# Patient Record
Sex: Male | Born: 1980 | Race: White | Hispanic: No | Marital: Married | Smoking: Never smoker
Health system: Southern US, Community
[De-identification: ages and names within clinical notes are randomized; demographics above are authoritative.]

## PROBLEM LIST (undated history)

## (undated) DIAGNOSIS — S62609A Fracture of unspecified phalanx of unspecified finger, initial encounter for closed fracture: Secondary | ICD-10-CM

## (undated) DIAGNOSIS — Q244 Congenital subaortic stenosis: Secondary | ICD-10-CM

## (undated) DIAGNOSIS — R569 Unspecified convulsions: Secondary | ICD-10-CM

## (undated) HISTORY — PX: TESTICLE SURGERY: SHX794

## (undated) HISTORY — PX: OTHER SURGICAL HISTORY: SHX169

---
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
## 2003-05-25 ENCOUNTER — Encounter: Payer: Self-pay | Admitting: *Deleted

## 2003-05-25 ENCOUNTER — Ambulatory Visit (HOSPITAL_COMMUNITY): Admission: RE | Admit: 2003-05-25 | Discharge: 2003-05-25 | Payer: Self-pay | Admitting: *Deleted

## 2004-12-01 ENCOUNTER — Ambulatory Visit: Payer: Self-pay

## 2004-12-01 ENCOUNTER — Ambulatory Visit: Payer: Self-pay | Admitting: Internal Medicine

## 2009-05-12 ENCOUNTER — Emergency Department (HOSPITAL_COMMUNITY): Admission: EM | Admit: 2009-05-12 | Discharge: 2009-05-12 | Payer: Self-pay | Admitting: Emergency Medicine

## 2009-10-18 IMAGING — CT CT HEAD W/O CM
1 series · 16 of 30 positions shown, 20 images · non-contrast
Comparison: None

CLINICAL DATA: Seizure

CT HEAD WITHOUT CONTRAST
TECHNIQUE: Contiguous axial images were obtained from the base of
the skull through the vertex without contrast.

[Series 2: trauma head · axial · 0.47mm/px · z∈[+124,+249]mm · 16 of 32 slices shown, 20 images]
[im 2/32  brain]
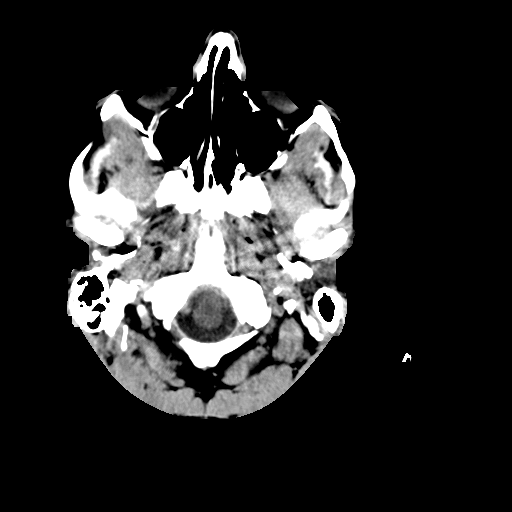
[im 2/32  bone]
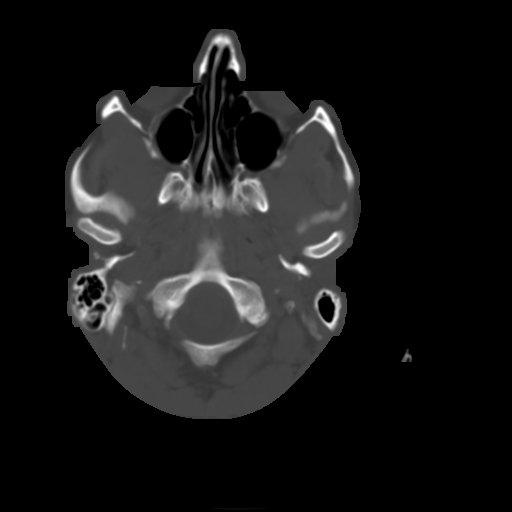
[im 4/32  brain]
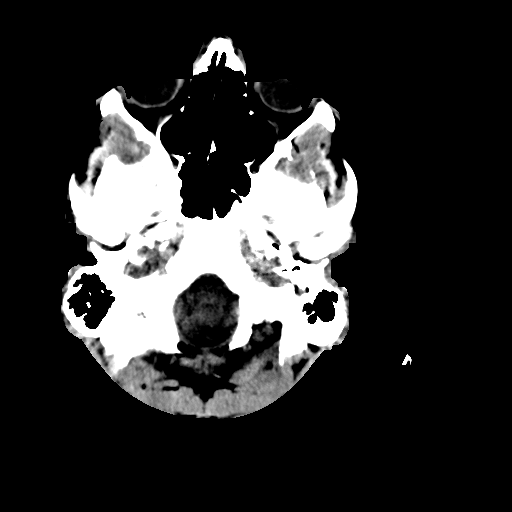
[im 6/32  brain]
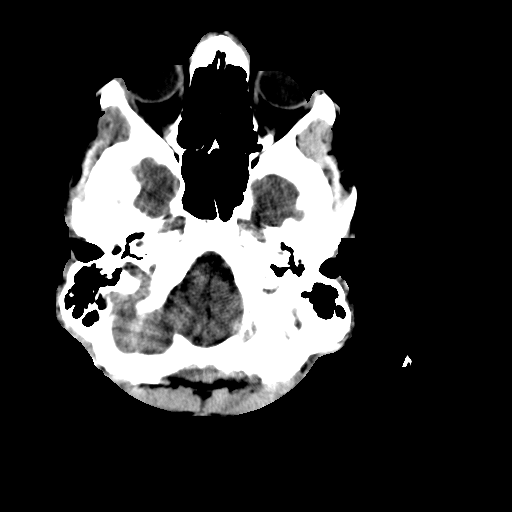
[im 8/32  brain]
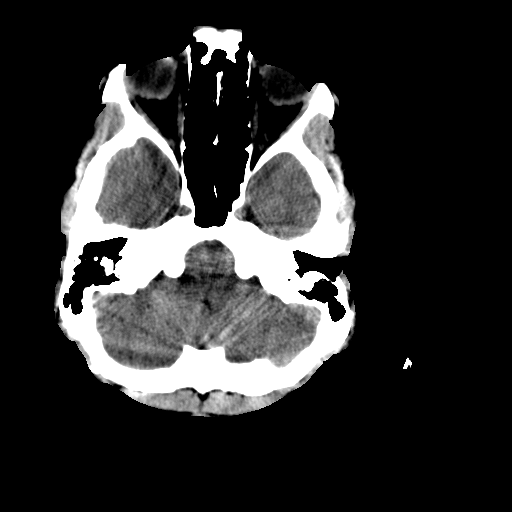
[im 9/32  brain]
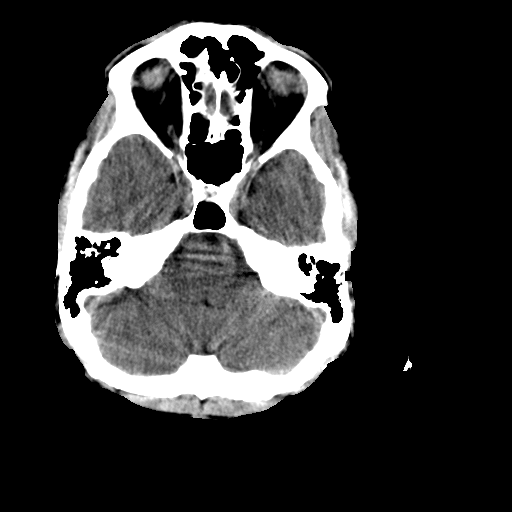
[im 9/32  bone]
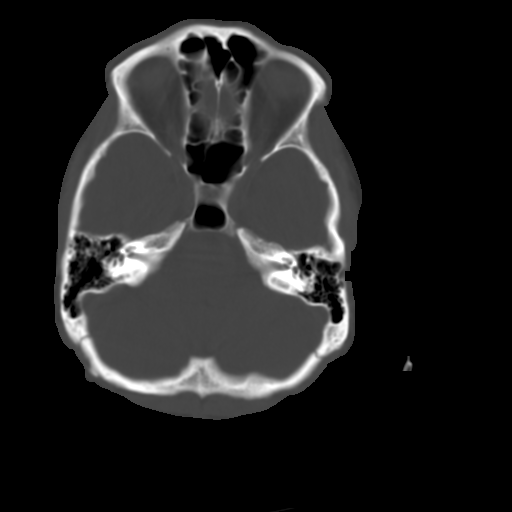
[im 11/32  brain]
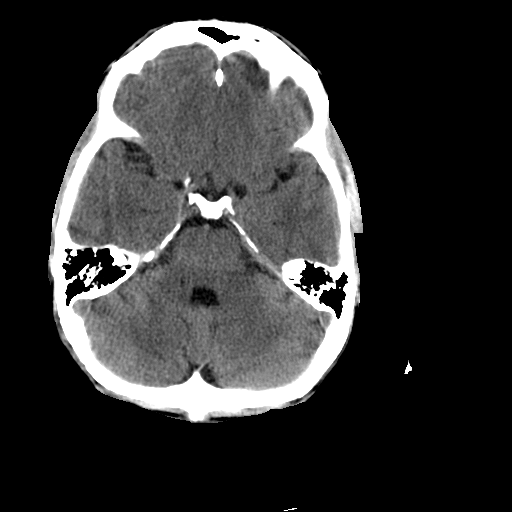
[im 13/32  brain]
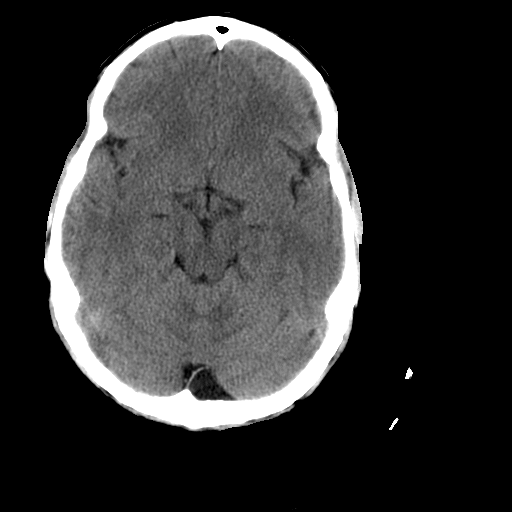
[im 15/32  brain]
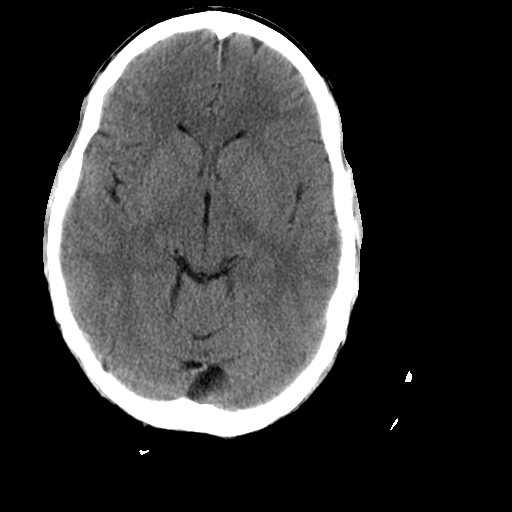
[im 17/32  brain]
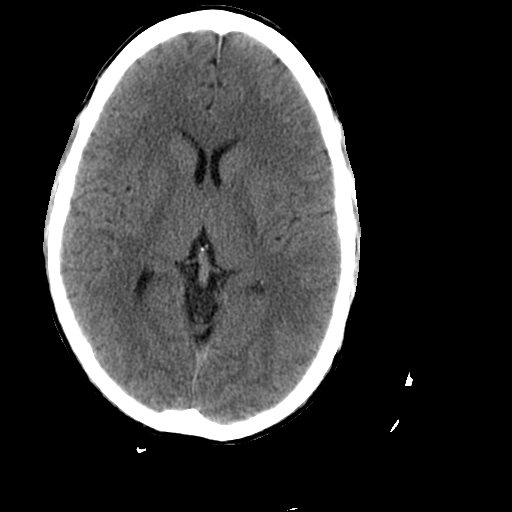
[im 17/32  bone]
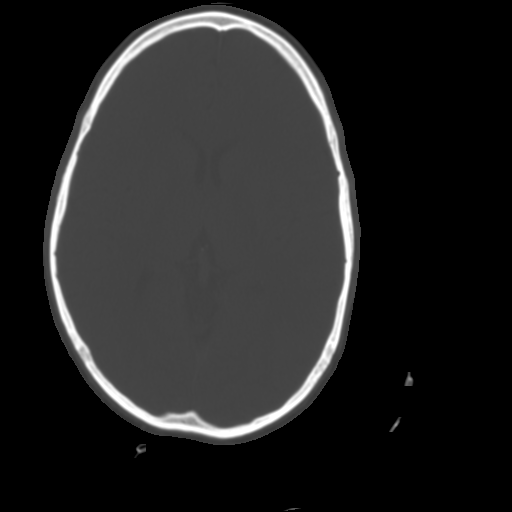
[im 19/32  brain]
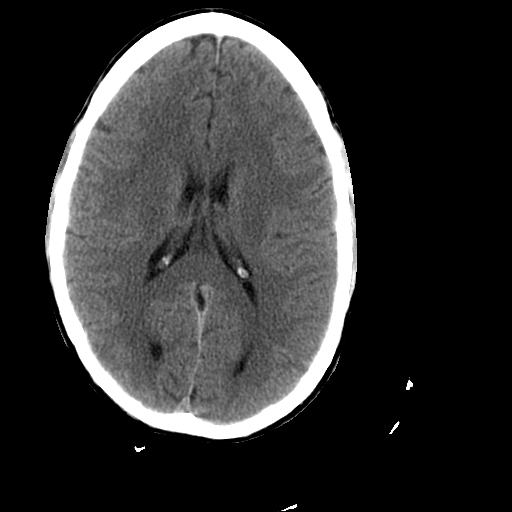
[im 21/32  brain]
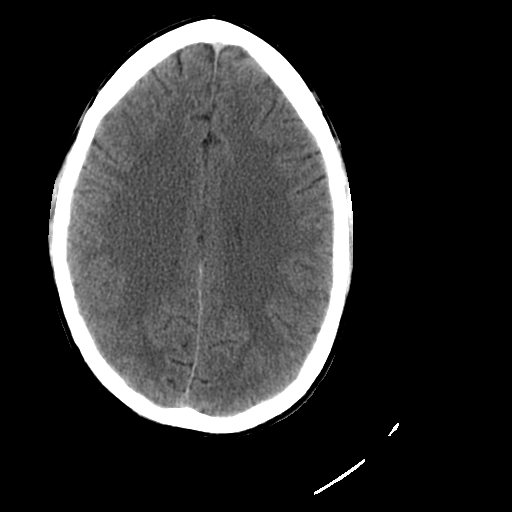
[im 23/32  brain]
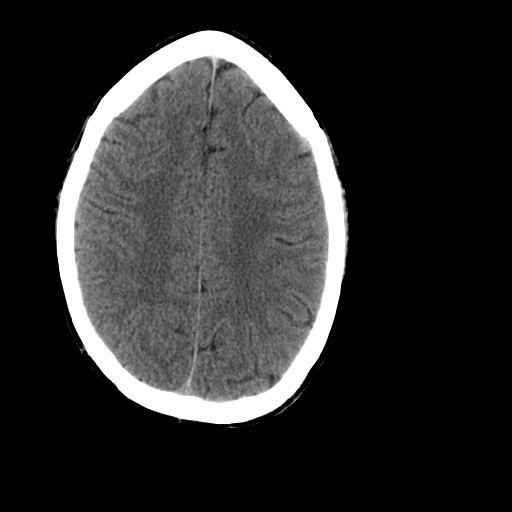
[im 24/32  brain]
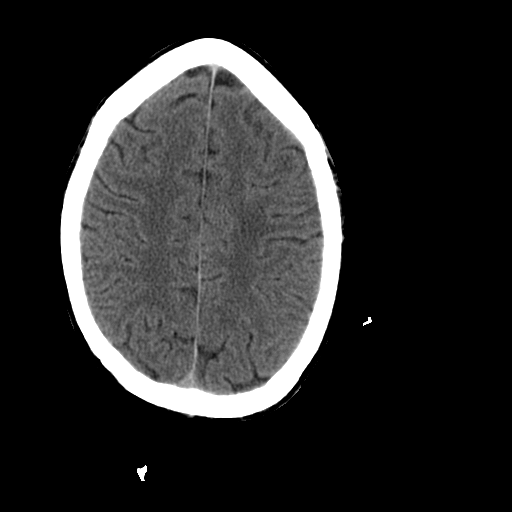
[im 24/32  bone]
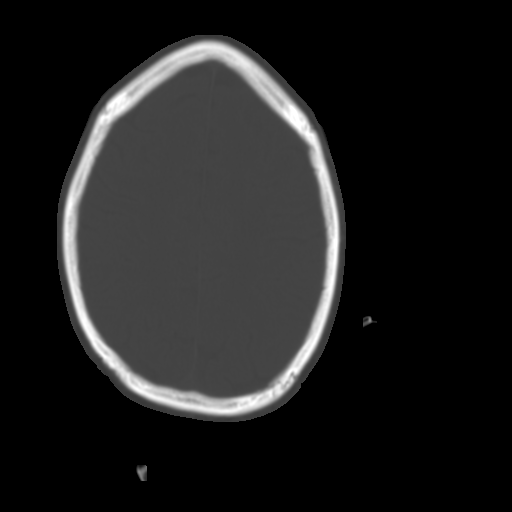
[im 26/32  brain]
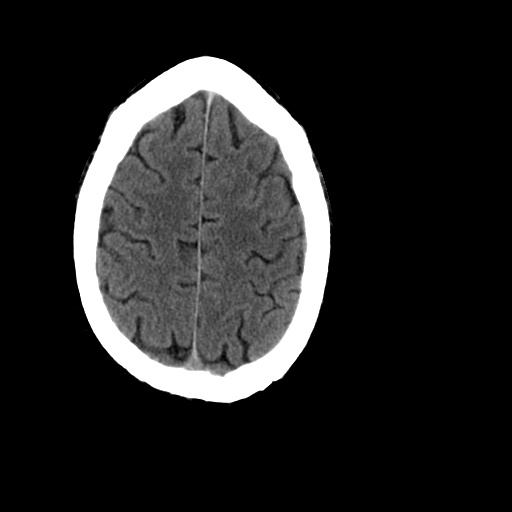
[im 28/32  brain]
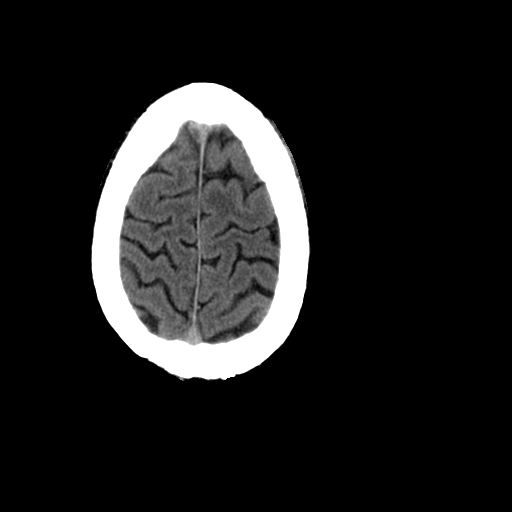
[im 30/32  brain]
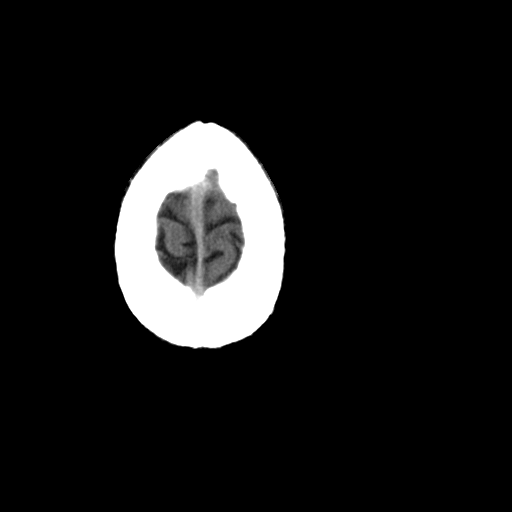

[16 of 30 positions shown; findings below may reference images not displayed]

FINDINGS: Ventricular size and CSF spaces within normal limits. No
evidence for acute infarct, bleed, or mass. No extra-axial fluid
collections or midline shift.  Calvarium intact.  No fluid in the
sinuses visualized.
IMPRESSION: No acute or significant findings.

## 2011-03-28 LAB — CBC
Hemoglobin: 14.9 g/dL (ref 13.0–17.0)
RBC: 4.9 MIL/uL (ref 4.22–5.81)
WBC: 7.3 10*3/uL (ref 4.0–10.5)

## 2011-03-28 LAB — DIFFERENTIAL
Basophils Absolute: 0 10*3/uL (ref 0.0–0.1)
Basophils Relative: 0 % (ref 0–1)
Eosinophils Absolute: 0 10*3/uL (ref 0.0–0.7)
Eosinophils Relative: 0 % (ref 0–5)
Monocytes Absolute: 0.4 10*3/uL (ref 0.1–1.0)
Monocytes Relative: 6 % (ref 3–12)
Neutro Abs: 5.1 10*3/uL (ref 1.7–7.7)
Neutrophils Relative %: 70 % (ref 43–77)

## 2011-03-28 LAB — COMPREHENSIVE METABOLIC PANEL
ALT: 14 U/L (ref 0–53)
BUN: 8 mg/dL (ref 6–23)
CO2: 21 mEq/L (ref 19–32)
Calcium: 9.5 mg/dL (ref 8.4–10.5)
Chloride: 107 mEq/L (ref 96–112)
Creatinine, Ser: 0.98 mg/dL (ref 0.4–1.5)
GFR calc Af Amer: >60 mL/min (ref >60)
Potassium: 4 mEq/L (ref 3.5–5.1)
Total Bilirubin: 0.5 mg/dL (ref 0.3–1.2)

## 2011-03-28 LAB — RAPID URINE DRUG SCREEN, HOSP PERFORMED: Amphetamines: NOT DETECTED

## 2011-11-06 ENCOUNTER — Encounter (HOSPITAL_BASED_OUTPATIENT_CLINIC_OR_DEPARTMENT_OTHER): Payer: Self-pay | Admitting: *Deleted

## 2011-11-06 NOTE — Pre-Procedure Instructions (Addendum)
Pt. has a cut on palmar surface left 5th finger  Pt. states has not seen a cardiologist since Dr. Candis Musa for his aortic stenosis, states is very mild and has not had any recent testing.  States no activity restrictions or problems with daily activities.

## 2011-11-07 ENCOUNTER — Other Ambulatory Visit: Payer: Self-pay | Admitting: Orthopedic Surgery

## 2011-11-07 ENCOUNTER — Ambulatory Visit (HOSPITAL_BASED_OUTPATIENT_CLINIC_OR_DEPARTMENT_OTHER): Payer: BC Managed Care – PPO | Admitting: Certified Registered"

## 2011-11-07 ENCOUNTER — Encounter (HOSPITAL_BASED_OUTPATIENT_CLINIC_OR_DEPARTMENT_OTHER): Payer: Self-pay | Admitting: Certified Registered"

## 2011-11-07 ENCOUNTER — Encounter (HOSPITAL_BASED_OUTPATIENT_CLINIC_OR_DEPARTMENT_OTHER)
Admission: RE | Disposition: A | Discharge status: Home / Self Care | Payer: Self-pay | Source: Ambulatory Visit | Attending: Orthopedic Surgery

## 2011-11-07 ENCOUNTER — Encounter (HOSPITAL_BASED_OUTPATIENT_CLINIC_OR_DEPARTMENT_OTHER): Payer: Self-pay | Admitting: Anesthesiology

## 2011-11-07 ENCOUNTER — Ambulatory Visit (HOSPITAL_BASED_OUTPATIENT_CLINIC_OR_DEPARTMENT_OTHER)
Admission: RE | Admit: 2011-11-07 | Discharge: 2011-11-07 | Disposition: A | Discharge status: Home / Self Care | Payer: BC Managed Care – PPO | Source: Ambulatory Visit | Attending: Orthopedic Surgery | Admitting: Orthopedic Surgery

## 2011-11-07 ENCOUNTER — Encounter (HOSPITAL_BASED_OUTPATIENT_CLINIC_OR_DEPARTMENT_OTHER): Payer: Self-pay | Admitting: *Deleted

## 2011-11-07 DIAGNOSIS — IMO0002 Reserved for concepts with insufficient information to code with codable children: Secondary | ICD-10-CM | POA: Insufficient documentation

## 2011-11-07 DIAGNOSIS — Y998 Other external cause status: Secondary | ICD-10-CM | POA: Insufficient documentation

## 2011-11-07 DIAGNOSIS — Z01812 Encounter for preprocedural laboratory examination: Secondary | ICD-10-CM | POA: Insufficient documentation

## 2011-11-07 DIAGNOSIS — R569 Unspecified convulsions: Secondary | ICD-10-CM | POA: Insufficient documentation

## 2011-11-07 DIAGNOSIS — J45909 Unspecified asthma, uncomplicated: Secondary | ICD-10-CM | POA: Insufficient documentation

## 2011-11-07 DIAGNOSIS — W108XXA Fall (on) (from) other stairs and steps, initial encounter: Secondary | ICD-10-CM | POA: Insufficient documentation

## 2011-11-07 DIAGNOSIS — Y9229 Other specified public building as the place of occurrence of the external cause: Secondary | ICD-10-CM | POA: Insufficient documentation

## 2011-11-07 HISTORY — DX: Congenital subaortic stenosis: Q24.4

## 2011-11-07 HISTORY — PX: ORIF FINGER FRACTURE: SHX2122

## 2011-11-07 HISTORY — DX: Unspecified convulsions: R56.9

## 2011-11-07 HISTORY — DX: Fracture of unspecified phalanx of unspecified finger, initial encounter for closed fracture: S62.609A

## 2011-11-07 SURGERY — OPEN REDUCTION INTERNAL FIXATION (ORIF) METACARPAL (FINGER) FRACTURE
Anesthesia: General | Site: Finger | Laterality: Left | Wound class: Clean

## 2011-11-07 MED ORDER — OXYCODONE-ACETAMINOPHEN 5-325 MG PO TABS
1.0000 | ORAL_TABLET | Freq: Once | ORAL | Status: AC | PRN
  Administered 2011-11-07: 1 via ORAL

## 2011-11-07 MED ORDER — CEFAZOLIN SODIUM-DEXTROSE 2-3 GM-% IV SOLR
2.0000 g | Freq: Once | INTRAVENOUS | Status: AC
  Administered 2011-11-07: 2 g via INTRAVENOUS

## 2011-11-07 MED ORDER — ONDANSETRON HCL 4 MG/2ML IJ SOLN: 4.0000 mg | Freq: Four times a day (QID) | INTRAMUSCULAR | Status: DC | PRN

## 2011-11-07 MED ORDER — ONDANSETRON HCL 4 MG/2ML IJ SOLN
INTRAMUSCULAR | Status: DC | PRN
  Administered 2011-11-07: 4 mg via INTRAVENOUS

## 2011-11-07 MED ORDER — CEPHALEXIN 500 MG PO CAPS: 500.0000 mg | ORAL_CAPSULE | Freq: Three times a day (TID) | ORAL | Status: AC

## 2011-11-07 MED ORDER — DEXAMETHASONE SODIUM PHOSPHATE 4 MG/ML IJ SOLN
INTRAMUSCULAR | Status: DC | PRN
  Administered 2011-11-07: 10 mg via INTRAVENOUS

## 2011-11-07 MED ORDER — CHLORHEXIDINE GLUCONATE 4 % EX LIQD: 60.0000 mL | Freq: Once | CUTANEOUS | Status: DC

## 2011-11-07 MED ORDER — HYDROMORPHONE HCL PF 1 MG/ML IJ SOLN: 0.2500 mg | INTRAMUSCULAR | Status: DC | PRN

## 2011-11-07 MED ORDER — MIDAZOLAM HCL 2 MG/2ML IJ SOLN: 1.0000 mg | INTRAMUSCULAR | Status: DC | PRN

## 2011-11-07 MED ORDER — LACTATED RINGERS IV SOLN
INTRAVENOUS | Status: DC
  Administered 2011-11-07: 14:00:00 via INTRAVENOUS

## 2011-11-07 MED ORDER — OXYCODONE-ACETAMINOPHEN 5-325 MG PO TABS: ORAL_TABLET | ORAL | Status: DC

## 2011-11-07 MED ORDER — CEFAZOLIN SODIUM 1-5 GM-% IV SOLN: 1.0000 g | Freq: Once | INTRAVENOUS | Status: DC

## 2011-11-07 MED ORDER — LIDOCAINE HCL (PF) 2 % IJ SOLN
INTRAMUSCULAR | Status: DC | PRN
  Administered 2011-11-07: 4 mL

## 2011-11-07 MED ORDER — MIDAZOLAM HCL 5 MG/5ML IJ SOLN
INTRAMUSCULAR | Status: DC | PRN
  Administered 2011-11-07: 2 mg via INTRAVENOUS

## 2011-11-07 MED ORDER — FENTANYL CITRATE 0.05 MG/ML IJ SOLN: 50.0000 ug | INTRAMUSCULAR | Status: DC | PRN

## 2011-11-07 MED ORDER — PROPOFOL 10 MG/ML IV EMUL
INTRAVENOUS | Status: DC | PRN
  Administered 2011-11-07: 200 mg via INTRAVENOUS

## 2011-11-07 MED ORDER — FENTANYL CITRATE 0.05 MG/ML IJ SOLN
INTRAMUSCULAR | Status: DC | PRN
  Administered 2011-11-07: 50 ug via INTRAVENOUS
  Administered 2011-11-07: 100 ug via INTRAVENOUS
  Administered 2011-11-07: 50 ug via INTRAVENOUS

## 2011-11-07 MED ORDER — LIDOCAINE HCL (CARDIAC) 20 MG/ML IV SOLN
INTRAVENOUS | Status: DC | PRN
  Administered 2011-11-07: 25 mg via INTRAVENOUS

## 2011-11-07 SURGICAL SUPPLY — 64 items
BANDAGE ADHESIVE 1X3 (GAUZE/BANDAGES/DRESSINGS) IMPLANT
BANDAGE ELASTIC 3 VELCRO ST LF (GAUZE/BANDAGES/DRESSINGS) ×2 IMPLANT
BANDAGE GAUZE ELAST BULKY 4 IN (GAUZE/BANDAGES/DRESSINGS) IMPLANT
BIT DRILL 1.0 (BIT) ×2
BIT DRILL 1.0X50 (BIT) ×1 IMPLANT
BLADE MINI RND TIP GREEN BEAV (BLADE) IMPLANT
BLADE SURG 15 STRL LF DISP TIS (BLADE) ×2 IMPLANT
BLADE SURG 15 STRL SS (BLADE) ×2
BNDG COHESIVE 1X5 TAN STRL LF (GAUZE/BANDAGES/DRESSINGS) IMPLANT
BNDG ESMARK 4X9 LF (GAUZE/BANDAGES/DRESSINGS) ×2 IMPLANT
BRUSH SCRUB EZ PLAIN DRY (MISCELLANEOUS) ×2 IMPLANT
CANISTER SUCTION 1200CC (MISCELLANEOUS) ×2 IMPLANT
CLOSURE STERI STRIP 1/2 X4 (GAUZE/BANDAGES/DRESSINGS) ×2 IMPLANT
CLOTH BEACON ORANGE TIMEOUT ST (SAFETY) ×2 IMPLANT
CORDS BIPOLAR (ELECTRODE) ×2 IMPLANT
COVER MAYO STAND STRL (DRAPES) ×2 IMPLANT
COVER TABLE BACK 60X90 (DRAPES) ×2 IMPLANT
CUFF TOURNIQUET SINGLE 18IN (TOURNIQUET CUFF) ×2 IMPLANT
DECANTER SPIKE VIAL GLASS SM (MISCELLANEOUS) IMPLANT
DRAPE OEC MINIVIEW 54X84 (DRAPES) ×2 IMPLANT
DRAPE SURG 17X23 STRL (DRAPES) ×2 IMPLANT
GAUZE SPONGE 4X4 12PLY STRL LF (GAUZE/BANDAGES/DRESSINGS) ×2 IMPLANT
GLOVE BIO SURGEON STRL SZ 6.5 (GLOVE) ×2 IMPLANT
GLOVE BIOGEL M STRL SZ7.5 (GLOVE) ×2 IMPLANT
GLOVE ORTHO TXT STRL SZ7.5 (GLOVE) ×2 IMPLANT
GOWN PREVENTION PLUS XLARGE (GOWN DISPOSABLE) ×2 IMPLANT
GOWN STRL REIN XL XLG (GOWN DISPOSABLE) ×4 IMPLANT
NEEDLE 27GAX1X1/2 (NEEDLE) ×2 IMPLANT
NS IRRIG 1000ML POUR BTL (IV SOLUTION) ×2 IMPLANT
PACK BASIN DAY SURGERY FS (CUSTOM PROCEDURE TRAY) ×2 IMPLANT
PAD CAST 3X4 CTTN HI CHSV (CAST SUPPLIES) ×1 IMPLANT
PAD CAST 4YDX4 CTTN HI CHSV (CAST SUPPLIES) IMPLANT
PADDING CAST ABS 4INX4YD NS (CAST SUPPLIES) ×1
PADDING CAST ABS COTTON 4X4 ST (CAST SUPPLIES) ×1 IMPLANT
PADDING CAST COTTON 3X4 STRL (CAST SUPPLIES) ×1
PADDING CAST COTTON 4X4 STRL (CAST SUPPLIES)
PADDING UNDERCAST 2  STERILE (CAST SUPPLIES) ×2 IMPLANT
PLATE T 1.3 3H HEAD/8H SFT (Plate) ×1 IMPLANT
PLATE-T 1.3 3H HEAD/8H SFT (Plate) ×2 IMPLANT
SCREW SELF TAP CORTEX 1.3 10MM (Screw) ×4 IMPLANT
SCREW SELF TAP CORTEX 1.3 13MM (Screw) ×2 IMPLANT
SCREW SELF TAP CORTEX 1.3 6MM (Screw) ×4 IMPLANT
SCREW SELF TAP CORTEX 1.3 7MM (Screw) ×2 IMPLANT
SCREW SELF TAP CORTEX 1.3 9MM (Screw) ×2 IMPLANT
SLEEVE SCD COMPRESS KNEE MED (MISCELLANEOUS) ×2 IMPLANT
SPLINT PLASTER CAST XFAST 3X15 (CAST SUPPLIES) ×14 IMPLANT
SPLINT PLASTER XTRA FASTSET 3X (CAST SUPPLIES) ×14
SPONGE GAUZE 4X4 12PLY (GAUZE/BANDAGES/DRESSINGS) ×2 IMPLANT
STOCKINETTE 4X48 STRL (DRAPES) ×2 IMPLANT
SUCTION FRAZIER TIP 10 FR DISP (SUCTIONS) ×2 IMPLANT
SUT ETHILON 4 0 PS 2 18 (SUTURE) IMPLANT
SUT MERSILENE 4 0 P 3 (SUTURE) ×2 IMPLANT
SUT PROLENE 3 0 PS 2 (SUTURE) IMPLANT
SUT PROLENE 4 0 P 3 18 (SUTURE) ×2 IMPLANT
SUT VIC AB 4-0 P-3 18XBRD (SUTURE) ×1 IMPLANT
SUT VIC AB 4-0 P3 18 (SUTURE) ×1
SYR 3ML 23GX1 SAFETY (SYRINGE) IMPLANT
SYR BULB 3OZ (MISCELLANEOUS) ×2 IMPLANT
SYR CONTROL 10ML LL (SYRINGE) ×2 IMPLANT
TOWEL OR 17X24 6PK STRL BLUE (TOWEL DISPOSABLE) ×2 IMPLANT
TRAY DSU PREP LF (CUSTOM PROCEDURE TRAY) ×2 IMPLANT
TUBE CONNECTING 20X1/4 (TUBING) ×2 IMPLANT
UNDERPAD 30X30 INCONTINENT (UNDERPADS AND DIAPERS) ×2 IMPLANT
WATER STERILE IRR 1000ML POUR (IV SOLUTION) ×2 IMPLANT

## 2011-11-07 NOTE — Op Note (Signed)
Op dictation: [id number] MRN:  [id number] CSN:  [id number]

## 2011-11-07 NOTE — Brief Op Note (Signed)
11/07/2011  5:50 PM  PATIENT:  Maple Hudson  30 y.o. male  PRE-OPERATIVE DIAGNOSIS: Comminuted  left small proximal phalanx  finger fracture  POST-OPERATIVE DIAGNOSIS:  Comminuted left small proximal phalanx finger fracture  PROCEDURE:  Procedure(s): OPEN REDUCTION INTERNAL FIXATION (ORIF) LEFT SMALL PROXIMAL PHALANX (FINGER) FRACTURE  SURGEON:  Surgeon(s): Wyn Forster., MD  PHYSICIAN ASSISTANT:   ASSISTANTS: Mallory Shirk.A-C  ANESTHESIA:   general  EBL:  Total I/O In: 1000 [I.V.:1000] Out: -   BLOOD ADMINISTERED:none  DRAINS: none   LOCAL MEDICATIONS USED:  LIDOCAINE 4 CC  SPECIMEN:  No Specimen  DISPOSITION OF SPECIMEN:  N/A  COUNTS:  YES  TOURNIQUET:  * Missing tourniquet times found for documented tourniquets in log:  [id number] *  DICTATION: .Other Dictation: Dictation Number [id number]  PLAN OF CARE: Discharge to home after PACU  PATIENT DISPOSITION:  PACU - guarded condition.

## 2011-11-07 NOTE — Anesthesia Postprocedure Evaluation (Signed)
  Anesthesia Post-op Note  Patient: Nicholas [name]  Procedure(s) Performed:  OPEN REDUCTION INTERNAL FIXATION (ORIF) METACARPAL (FINGER) FRACTURE - left small proximal phalanx  Patient Location: PACU  Anesthesia Type: General  Level of Consciousness: awake  Airway and Oxygen Therapy: Patient connected to nasal cannula oxygen  Post-op Pain: none  Post-op Assessment: Post-op Vital signs reviewed  Post-op Vital Signs: stable  Complications: No apparent anesthesia complications

## 2011-11-07 NOTE — H&P (Signed)
  Nicholas [name] is an 30 y.o. male.   Chief Complaint: c/o injury to left small finger HPI: Pt is a 30 y/o right  Hand dominant male who sustained an injury to his left small finger [place]05[place]11. No immediate medical evaluation. Pt was seen by his primary care MD on 11/06/11 for evaluation and referred for further treatment.  Past Medical History  Diagnosis Date  . Asthma     no current meds.  . Seizures     seizure x 1 3 yrs. ago - no cause found  . Subaortic stenosis     states is mild, no cardiologist, last echo over 5 yrs. ago  . Finger fracture, left     left 5th; fell down subway 11/05/2011    Past Surgical History  Procedure Date  . Testicle surgery age 48-13    History reviewed. No pertinent family history. Social History:  reports that he has never smoked. He has never used smokeless tobacco. He reports that he drinks alcohol. He reports that he does not use illicit drugs.  Allergies: No Known Allergies  Medications Prior to Admission  Medication Dose Route Frequency Provider Last Rate Last Dose  . lactated ringers infusion   Intravenous Continuous E. Jairo Ben, MD       No current outpatient prescriptions on file as of 11/07/2011.    No results found for this or any previous visit (from the past 48 hour(s)).  No results found.   Pertinent items are noted in HPI.  Blood pressure 149/92, pulse 77, temperature 97.7 F (36.5 C), temperature source Oral, resp. rate 20, height 5\' 9"  (1.753 m), weight 94.348 kg (208 lb), SpO2 97.00%.  General appearance: alert Head: Normocephalic, without obvious abnormality, atraumatic Neck: supple, symmetrical, trachea midline Resp: clear to auscultation bilaterally Cardio: regular rate and rhythm, S1, S2 normal, no murmur, click, rub or gallop GI: normal findings: bowel sounds normal Extremities:Left small finger reveals obvious swelling and deformity at the proximal phalanx. N/[name]  intact distally. X-rays revealed comminuted fracture of the proximal phalanx with angulation. Pulses: 2+ and symmetric Skin: Skin color, texture, turgor normal. No rashes or lesions or normal Neurologic: Grossly normal    Assessment/Plan  Impression: Comminuted fracture of the proximal phalanx left small finger.  Plan: To OR for ORIF left small finger proximal phalanx.  DASNOIT,Nicholas [name] 11/07/2011, 2:05 PM H&P documentation: 10/2011  -History and Physical Reviewed  -Patient has been re-examined  -No change in the plan of care  Nicholas [name],Nicholas [name]

## 2011-11-07 NOTE — Op Note (Signed)
NAME:  Nicholas [name] NO.:  MEDICAL RECORD NO.:  [id number]  LOCATION:                                 FACILITY:  PHYSICIAN:  Nicholas [name], M.D.      DATE OF BIRTH:  DATE OF PROCEDURE:  11/07/2011 DATE OF DISCHARGE:                              OPERATIVE REPORT   PREOPERATIVE DIAGNOSIS:  Severely comminuted, telescoped and apex volar angulated fracture of left small finger proximal metaphysis and epiphysis.  POSTOPERATIVE DIAGNOSIS:  Severely comminuted, telescoped and apex volar angulated fracture of left small finger proximal metaphysis and epiphysis.  OPERATION:  Open reduction and internal fixation with an ASIF 1.3-mm bridge plate to regain length and correct rotation and malalignment.  OPERATING SURGEON:  Nicholas Fitch. Nicholas Ogan, MD.  ASSISTANT:  Nicholas Reeks. Dasnoit, PA-C.  ANESTHESIA:  General by LMA.  SUPERVISING ANESTHESIOLOGIST:  Nicholas Skye. Krista Blue, MD.  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  He was attending a bachelor party in [place] on November 05, 2011, when [place] subway steps, he slipped and fell, landing hard onto Nicholas outstretched left hand.  He had immediate pain, swelling, deformity, and bruising of Nicholas hand.  He buddy taped Nicholas ring and small fingers and returned home.  He was seen at [facility] by Dr. Cleda [name] who noted a comminuted unstable fracture.  Dr. Cleda [name] requested an upper extremity Orthopedic consult.  He was seen for evaluation in our office on November 06, 2011, and noted to have a very impacted telescope-type fracture with loss of length, comminution of Nicholas ulnar cortex and apex volar angulation.  We had detailed informed consent, in which, we pointed out that this was one of the more complex fractures dealt with in hand surgery.  We advised him to undergo open reduction and internal fixation with plate fixation essentially as a bridge plate  to obtain and maintain the reduction trying our best to maintain the length of Nicholas proximal phalanx.  The phalanx had shortened more than 4 mm due to telescoping of the diaphysis and proximal metaphysis into the epiphysis.  Preoperatively, questions were invited and answered in detail.  He was noted to have no drug allergies.  Informed consent was provided, in which, we pointed out that he will likely have stiffness in the finger for 3 or more months.  He will require some therapy.  He will need to be immobilized in a proper splint for a minimum of 6 weeks postop.  We reviewed potential complications including infection, failure to completely reduce the fracture, possible hardware failure, and possibility that he might require revision surgery for tenolysis and plate removal at a later date.  Questions were invited and answered in detail both in the office and in the holding area.  PROCEDURE:  Nicholas [name] was brought to room 2 of the [facility] and placed in supine position on the operating table.  Following informed consent by Nicholas [name], general anesthesia by LMA technique was induced under Nicholas direct supervision.  Nicholas [name] received 2 g of Ancef as an IV prophylactic antibiotic followed by routine Betadine scrub and paint of  left upper extremity.  Following exsanguination of the left arm with an Esmarch bandage, the arterial tourniquet on proximal brachium was inflated to 225 mmHg. After routine surgical time-out, the procedure commenced with curvilinear incision, exposing the dorsal aspect of the left small finger proximal phalanx, extensor mechanism of the MP joint capsule.  The extensor split in the midline into the MP joint capsule.  The periosteum was carefully elevated and the fracture identified.  The shaft of the proximal phalanx had telescoped into the epiphysis.  There was marked comminution on the ulnar aspect of the metaphysis.  The joint  was split, but not displaced.  We left the periosteum and capsular ligaments intact to prevent displacement of the articular fracture fragments.  An ASIF 1.3-mm stainless steel plate was selected and contoured to fit the phalanx followed by shortening to 4 tail screws and 3 base screws.  The plate was carefully placed with fluoroscopic control.  Screw length was controlled by use of a measuring device as well as direct radiographic measurement.  A virtually anatomic reduction was achieved.  This was a bit of a tenuous construct due to this degree of comminution of the fracture.  The wound was then irrigated with sterile saline followed by repair of the periosteum with figure-of-eight sutures of 4-0 Vicryl followed by repair of the extensor mechanism with segmental running 4-0 Mersilene. The skin was repaired with subcutaneous 4-0 Vicryl and intradermal 3-0 Prolene.  Nicholas [name] was placed in a very stout forearm-based ulnar gutter splint, sandwich style incorporating the ring and small fingers. Unfortunately, he is scheduled to be married in 4 days and will need to attend Nicholas wedding with Nicholas splint in place.  He will return to our office for followup in 5-7 days, at which time, we anticipate splint removal and fabrication of a thermoplastic postoperative splint.  Questions regarding Nicholas predicament were invited and answered in detail with Nicholas [name] and Nicholas [name].     Nicholas Fitch Luan Maberry, M.D.     RVS/MEDQ  D:  11/07/2011  T:  11/07/2011  Job:  [id number]

## 2011-11-07 NOTE — Anesthesia Procedure Notes (Addendum)
Procedure Name: LMA Insertion Date/Time: 11/07/2011 4:46 PM Performed by: Zenia Resides D Pre-anesthesia Checklist: Patient identified, Emergency Drugs available, Suction available and Patient being monitored Patient Re-evaluated:Patient Re-evaluated prior to inductionOxygen Delivery Method: Circle System Utilized Preoxygenation: Pre-oxygenation with 100% oxygen Intubation Type: IV induction Ventilation: Mask ventilation without difficulty LMA: LMA with gastric port inserted LMA Size: 5.0 Number of attempts: 1 Placement Confirmation: positive ETCO2 and breath sounds checked- equal and bilateral Tube secured with: Tape Dental Injury: Teeth and Oropharynx as per pre-operative assessment

## 2011-11-07 NOTE — Anesthesia Preprocedure Evaluation (Signed)
Anesthesia Evaluation  Patient identified by MRN, date of birth, ID band Patient awake    Reviewed: Allergy & Precautions, H&P , NPO status , Patient's Chart, lab work & pertinent test results  Airway Mallampati: II  Neck ROM: full    Dental   Pulmonary asthma ,          Cardiovascular     Neuro/Psych Seizures -,     GI/Hepatic   Endo/Other    Renal/GU      Musculoskeletal   Abdominal   Peds  Hematology   Anesthesia Other Findings Subaortic stenosis is mild, per patient.  He has no physical limitations.  Reproductive/Obstetrics                           Anesthesia Physical Anesthesia Plan  ASA: II  Anesthesia Plan: General   Post-op Pain Management:    Induction: Intravenous  Airway Management Planned: LMA  Additional Equipment:   Intra-op Plan:   Post-operative Plan:   Informed Consent: I have reviewed the patients History and Physical, chart, labs and discussed the procedure including the risks, benefits and alternatives for the proposed anesthesia with the patient or authorized representative who has indicated his/her understanding and acceptance.     Plan Discussed with: CRNA and Surgeon  Anesthesia Plan Comments:         Anesthesia Quick Evaluation

## 2011-11-07 NOTE — Transfer of Care (Signed)
Immediate Anesthesia Transfer of Care Note  Patient: Nicholas [name]  Procedure(s) Performed:  OPEN REDUCTION INTERNAL FIXATION (ORIF) METACARPAL (FINGER) FRACTURE - left small proximal phalanx  Patient Location: PACU  Anesthesia Type: General  Level of Consciousness: awake, alert  and oriented  Airway & Oxygen Therapy: Patient Spontanous Breathing and Patient connected to face mask oxygen  Post-op Assessment: Report given to PACU RN  Post vital signs: stable  Complications: No apparent anesthesia complications

## 2011-11-13 ENCOUNTER — Encounter (HOSPITAL_BASED_OUTPATIENT_CLINIC_OR_DEPARTMENT_OTHER): Payer: Self-pay | Admitting: Orthopedic Surgery

## 2012-05-29 ENCOUNTER — Ambulatory Visit (INDEPENDENT_AMBULATORY_CARE_PROVIDER_SITE_OTHER): Payer: Managed Care, Other (non HMO) | Admitting: Family Medicine

## 2012-05-29 VITALS — BP 140/88 | HR 74 | Temp 98.5°F | Resp 16 | Ht 69.0 in | Wt 211.0 lb

## 2012-05-29 DIAGNOSIS — R509 Fever, unspecified: Secondary | ICD-10-CM

## 2012-05-29 DIAGNOSIS — B349 Viral infection, unspecified: Secondary | ICD-10-CM

## 2012-05-29 LAB — POCT SEDIMENTATION RATE: POCT SED RATE: 12 mm/hr (ref 0–22)

## 2012-05-29 LAB — POCT CBC
Granulocyte percent: 55.1 %G (ref 37–80)
MCV: 88.6 fL (ref 80–97)
MID (cbc): 0.4 (ref 0–0.9)
POC Granulocyte: 1.6 — AB (ref 2–6.9)
Platelet Count, POC: 157 10*3/uL (ref 142–424)
RBC: 5.35 M/uL (ref 4.69–6.13)
RDW, POC: 13.6 %

## 2012-05-29 NOTE — Progress Notes (Signed)
  Subjective: 31 year old man who is generally healthy but the last 3 days his been ill. He's been running a fever. He just doesn't feel good. He is fatigued. Has some headache. Denies sore throat. Does not have much in way of her cough. Not a great appetite but not had any major abdominal pain or problems. Wife has been healthy. They have no children.  Objective: TMs normal. Throat clear. Neck supple without significant nodes. Chest is clear. Heart regular without murmurs. Abdomen soft nontender. General he he does not appear severely ill, just doesn't feel well.  Assessment:  Fever, probable viral syndrome  Plan: CBC and sedimentation rate  Results for orders placed in visit on 05/29/12  POCT CBC      Component Value Range   WBC 2.9 (*) 4.6 - 10.2 K/uL   Lymph, poc 0.9  0.6 - 3.4   POC LYMPH PERCENT 31.8  10 - 50 %L   MID (cbc) 0.4  0 - 0.9   POC MID % 13.1 (*) 0 - 12 %M   POC Granulocyte 1.6 (*) 2 - 6.9   Granulocyte percent 55.1  37 - 80 %G   RBC 5.35  4.69 - 6.13 M/uL   Hemoglobin 15.8  14.1 - 18.1 g/dL   HCT, POC 16.1  09.6 - 53.7 %   MCV 88.6  80 - 97 fL   MCH, POC 29.5  27 - 31.2 pg   MCHC 33.3  31.8 - 35.4 g/dL   RDW, POC 04.5     Platelet Count, POC 157  142 - 424 K/uL   MPV 7.3  0 - 99.8 fL   Labs are most consistent with a nonspecific viral illness.

## 2012-05-29 NOTE — Patient Instructions (Addendum)
Rest.  Return if fever persists or if you're getting sicker.

## 2012-10-25 ENCOUNTER — Ambulatory Visit (INDEPENDENT_AMBULATORY_CARE_PROVIDER_SITE_OTHER): Payer: Managed Care, Other (non HMO) | Admitting: Emergency Medicine

## 2012-10-25 VITALS — BP 129/82 | HR 70 | Temp 98.0°F | Resp 16 | Ht 69.5 in | Wt 209.0 lb

## 2012-10-25 DIAGNOSIS — R197 Diarrhea, unspecified: Secondary | ICD-10-CM

## 2012-10-25 NOTE — Patient Instructions (Signed)
Travelers' Diarrhea  Travelers' diarrhea (TD) is the most common illness affecting travelers. Each year many travelers develop diarrhea. TD usually occurs within the first week of travel. However, it may occur at any time while traveling. It may even occur after returning home. The most important risk factor is where you are going. High-risk places are the developing countries of:   [place].   [place].   The [place].   [place].  High risk people include young adults and those with:   Transplants.   HIV infections.   Medicine that suppresses the immune system.   Inflammatory-bowel disease.   Diabetes.   H-2 blockers or antacids.  Attack rates are similar for men and women. The primary source of TD is eating or drinking food or water tainted with feces (stool or bowel movements).  CAUSES   Infectious agents are the primary cause of TD. Germs cause almost 80% of TD cases. The most common germ produces:   Watery diarrhea with cramps.   Low-grade or no fever.  There are many other bacterial, viral and parasitic pathogens (disease causing "bugs").   SYMPTOMS   Most TD cases begin suddenly. Symptoms include stool that is increased in:   Frequency.   Volume.   Weight.  Altered stool consistency also is common. Typically, you have four to five loose or watery bowel movements each day. Other common symptoms are:   Nausea.   Vomiting.   Diarrhea.   Abdominal cramping.   Bloating.   Fever.   Urgency.   Malaise.  Most cases are not dangerous. Most cases go away in 1-2 days without treatment. TD is rarely life threatening. 90% of cases resolve within 1 week. 98% resolve within 1 month.  PREVENTION    Avoid foods or beverages purchased from street vendors in high risk countries.   Avoid food from places where unclean conditions are present.   Avoid raw or undercooked meat and seafood.   Avoid raw fruits (e.g., oranges, bananas, avocados) and vegetables unless you peel them yourself.   If handled  properly, well-cooked and packaged foods usually are safe. Foods associated with increased risk for TD include:   Tap water.   Ice.   Unpasteurized milk.   Dairy products.   Safe beverages include:   Bottled carbonated beverages.   Hot tea or coffee.   Beer.   Wine.   Boiled water.   Water treated with iodine or chlorine.  ANTIBIOTICS ARE NOT RECOMMENDED AS PREVENTION   CDC (Centers for Disease Control) does not recommend antimicrobial drugs (medicine that kill germs) to prevent TD. Several studies show that Pepto-Bismol taken as either 2 tablets 4 times daily, or 2 fluid ounces 4 times daily, reduces the incidence of travelers' diarrhea. People that should avoid Pepto-Bismol include those who are:   Pregnant.   Allergic to aspirin.   Taking anticoagulants medicine (probenecid, methotrexate).   Be informed about potential side effects, in particular about temporary blackening of the tongue and stool, and rarely ringing in the ears. Because of potential adverse side effects, preventative Pepto-Bismol should not be used for more than 3 weeks.   Some antibiotics taken in a once-a-day dose are 90% effective at preventing travelers' diarrhea. However, antibiotics are not recommended as prevention. Routine antimicrobial prophylaxis increases your risk for:   Adverse reactions.   Infections with resistant organisms.   Antibiotics can increase your susceptibility to resistant bacterial pathogens and provide no protection against either viral or parasitic pathogens. This   can give travelers a false sense of security. As a result, strict adherence to preventive measures is encouraged. Pepto-Bismol should be used as an extra effort if prophylaxis is needed.  TREATMENT    TD usually is a self-limited disorder. It gets well without treatment. It often goes away without specific treatment. Oral re-hydration is often helpful to replace lost fluids and electrolytes. Clear liquids are routinely recommended  for adults. You may be helped with antimicrobial therapy if you develop three or more loose stools in an 8-hour period, especially if associated with:   Nausea.   Vomiting.   Abdominal cramps.   Fever.   Blood in stools.   Antibiotics usually are given for 3-5 days. Pepto-Bismol also may be used as treatment. Take one fluid ounce, or two 262 mg tablets every 30 minutes, for up to 8 doses in a 24-hour period. This can be repeated on a second day. If diarrhea persists despite therapy, you should be evaluated by a caregiver and treated for possible parasitic infection.   Because drug resistance is a continuing problem and may vary from country to country, professional assistance should be looked for if problems persist.   Antimotility agents (loperamide, diphenoxylate, and paregoric) mostly reduce diarrhea by slowing down the passage of food and drink in the gut. This allows more time for absorption. Some persons believe diarrhea is the body's defense mechanism to minimize contact time between gut pathogens and lining of the bowel. In several studies, antimotility agents have been useful in treating travelers' diarrhea by decreasing the duration of diarrhea. However, these agents should never be used by persons with fever or bloody diarrhea because they can increase the severity of disease by delaying clearance of causative organisms. Because antimotility agents are now available over the counter, their improper use is of concern. Complications have been reported from the use of these medicines such as:   Toxic megacolon.   Sepsis.   Disseminated intravascular coagulation.  SEEK IMMEDIATE MEDICAL CARE IF:    You are unable to keep fluids down.   Vomiting or diarrhea becomes persistent.   Abdominal (belly) pain develops or increases or localizes. (Right sided pain can be appendicitis and left sided pain in adults can be diverticulitis).   You develop an oral temperature above 102 F (38.9 C), or as your  caregiver suggests.   Diarrhea becomes excessive or contains blood or mucous.   Excessive weakness, dizziness, fainting or extreme thirst.   Checking weight 2 to 3 times per day in babies and children will help verify adequate fluid replacement. Your caregiver will tell you what loss should concern you or suggest another visit to your personal physician.   Record your weight or your child's weight today. Compare this to your home scale and record all weights and time and date weighed. Try to check weight at the same times every day. Bring this chart to your caregivers if you or your child needs to be seen again.  FOR MORE INFORMATION   Travelers should consult with a caregiver before departing on a trip abroad. Information about TD is available from:   Your local or state health departments.   World Health Organization (WHO).  Other information that may be of interest to travelers can be found at the CDC Travelers' Health homepage at http://www.cdc.gov/travel.  Document Released: 11/24/2002 Document Revised: 02/26/2012 Document Reviewed: 02/11/2009  [employer] Patient Information 2013 [employer].

## 2012-10-25 NOTE — Progress Notes (Signed)
[facility] [address] [place] [address] [phone]  Date:  10/25/2012   Name:  Nicholas [name]   DOB:  Jan 21, 1981   MRN:  [id number]  PCP:  No primary provider on file.    Chief Complaint: Diarrhea   History of Present Illness:  Nicholas [name] is a 31 y.o. very pleasant male patient who presents with the following:  Went to [place] last week for his honeymoon.  He ate a ceviche salad that he believes made him ill.  He and his wife generally ate the same food except that salad.  He is sick and she is not.  He has frequent 10-12 daily loose watery stools.  No blood or mucous or pus in stools.  No nausea or vomiting.  Denies other complaint.  No fever or chills.  No prior episodes of travelers' diarrhea  There is no problem list on file for this patient.   Past Medical History  Diagnosis Date  . Asthma     no current meds.  . Seizures     seizure x 1 3 yrs. ago - no cause found  . Subaortic stenosis     states is mild, no cardiologist, last echo over 5 yrs. ago  . Finger fracture, left     left 5th; fell down [address] 11/05/2011    Past Surgical History  Procedure Date  . Testicle [age] age [age]  . Orif finger fracture 11/07/2011    Procedure: OPEN REDUCTION INTERNAL FIXATION (ORIF) METACARPAL (FINGER) FRACTURE;  Surgeon: Wyn Forster., MD;  Location: [facility];  Service: Orthopedics;  Laterality: Left;  left small proximal phalanx    History  Substance Use Topics  . Smoking status: Never Smoker   . Smokeless tobacco: Never Used  . Alcohol Use: Yes     Comment: 1 x/wk.    No family history on file.  No Known Allergies  Medication list has been reviewed and updated.  Current Outpatient Prescriptions on File Prior to Visit  Medication Sig Dispense Refill  . oxyCODONE-acetaminophen (PERCOCET) 5-325 MG per tablet 1 or 2 by mouth every 4 hours as needed for pain.  30 tablet  0    Review of Systems:  As  per HPI, otherwise negative.    Physical Examination: Filed Vitals:   10/25/12 1611  BP: 129/82  Pulse: 70  Temp: 98 F (36.7 C)  Resp: 16   Filed Vitals:   10/25/12 1611  Height: 5' 9.5" (1.765 m)  Weight: 209 lb (94.802 kg)   Body mass index is 30.42 kg/(m^2). Ideal Body Weight: Weight in (lb) to have BMI = 25: 171.4   GEN: WDWN, NAD, Non-toxic, A & O x 3 HEENT: Atraumatic, Normocephalic. Neck supple. No masses, No LAD. Ears and Nose: No external deformity. CV: RRR, No M/G/R. No JVD. No thrill. No extra heart sounds. PULM: CTA B, no wheezes, crackles, rhonchi. No retractions. No resp. distress. No accessory muscle use. ABD: S, NT, ND, +BS. No rebound. No HSM. EXTR: No c/c/e NEURO Normal gait.  PSYCH: Normally interactive. Conversant. Not depressed or anxious appearing.  Calm demeanor.    Assessment and Plan: Travelers diarrhea Imodium Stool O&P, c&s for enteric pathogens Follow up as needed Clears No milk  Carmelina Dane, MD

## 2012-10-30 NOTE — Progress Notes (Signed)
Reviewed and agree.

## 2013-02-22 ENCOUNTER — Ambulatory Visit (INDEPENDENT_AMBULATORY_CARE_PROVIDER_SITE_OTHER): Payer: Managed Care, Other (non HMO) | Admitting: Family Medicine

## 2013-02-22 ENCOUNTER — Ambulatory Visit: Payer: Managed Care, Other (non HMO)

## 2013-02-22 VITALS — BP 118/80 | HR 72 | Temp 97.9°F | Resp 18 | Ht 69.5 in | Wt 210.0 lb

## 2013-02-22 DIAGNOSIS — M545 Low back pain, unspecified: Secondary | ICD-10-CM

## 2013-02-22 MED ORDER — DICLOFENAC SODIUM 75 MG PO TBEC: 75.0000 mg | DELAYED_RELEASE_TABLET | Freq: Two times a day (BID) | ORAL | Status: DC

## 2013-02-22 MED ORDER — CYCLOBENZAPRINE HCL 10 MG PO TABS: 10.0000 mg | ORAL_TABLET | Freq: Three times a day (TID) | ORAL | Status: DC | PRN

## 2013-02-22 MED ORDER — PREDNISONE 20 MG PO TABS: ORAL_TABLET | ORAL | Status: DC

## 2013-02-22 NOTE — Progress Notes (Signed)
Subjective: 32 year old man who's been having some pain in his low back for about a month. A week ago he fell off a dock in [place], feet first, and about 3 foot of water. The pain is been worse this week since that episode. He says the pain seems to be worse when he sitting a long time. He works as a Estate agent . He flew to [place], so did not have a long sit. He drives to states fell 3 days a week, and notices to be worse when he gets out of the car.  Objective: Patient has tenderness in his low back, especially in the right SI region. He has pain on flexion about half way and some pain on extension. Side to side tilt also hurts, though he does do adequate rotation of his back. Straight leg raise test essentially negative that causes him pain at about 80 bilaterally. Deep tendon reflexes are 2+, symmetrical  Assessment: Low back pain/lumbago  Plan: Due to the trauma did order a limited view lumbar spine series. [facility] reading (PRIMARY) by  Dr. Alwyn Ren Normal xrays back  Advised to take medications as directed. He needs to exercise his back some. If he is not doing better may want to refer him for physical therapy.   Marland Kitchen

## 2013-02-22 NOTE — Patient Instructions (Signed)
Take medications as directed  Please read the care of your back booklet and try to do the stretching exercises discussed.  Return if not improving.

## 2013-07-31 IMAGING — CR DG LUMBAR SPINE 2-3V
3 series · 3 of 3 positions shown · non-contrast
Comparison: None.

CLINICAL DATA: Low back pain

LUMBAR SPINE - 2-3 VIEW

[AP]
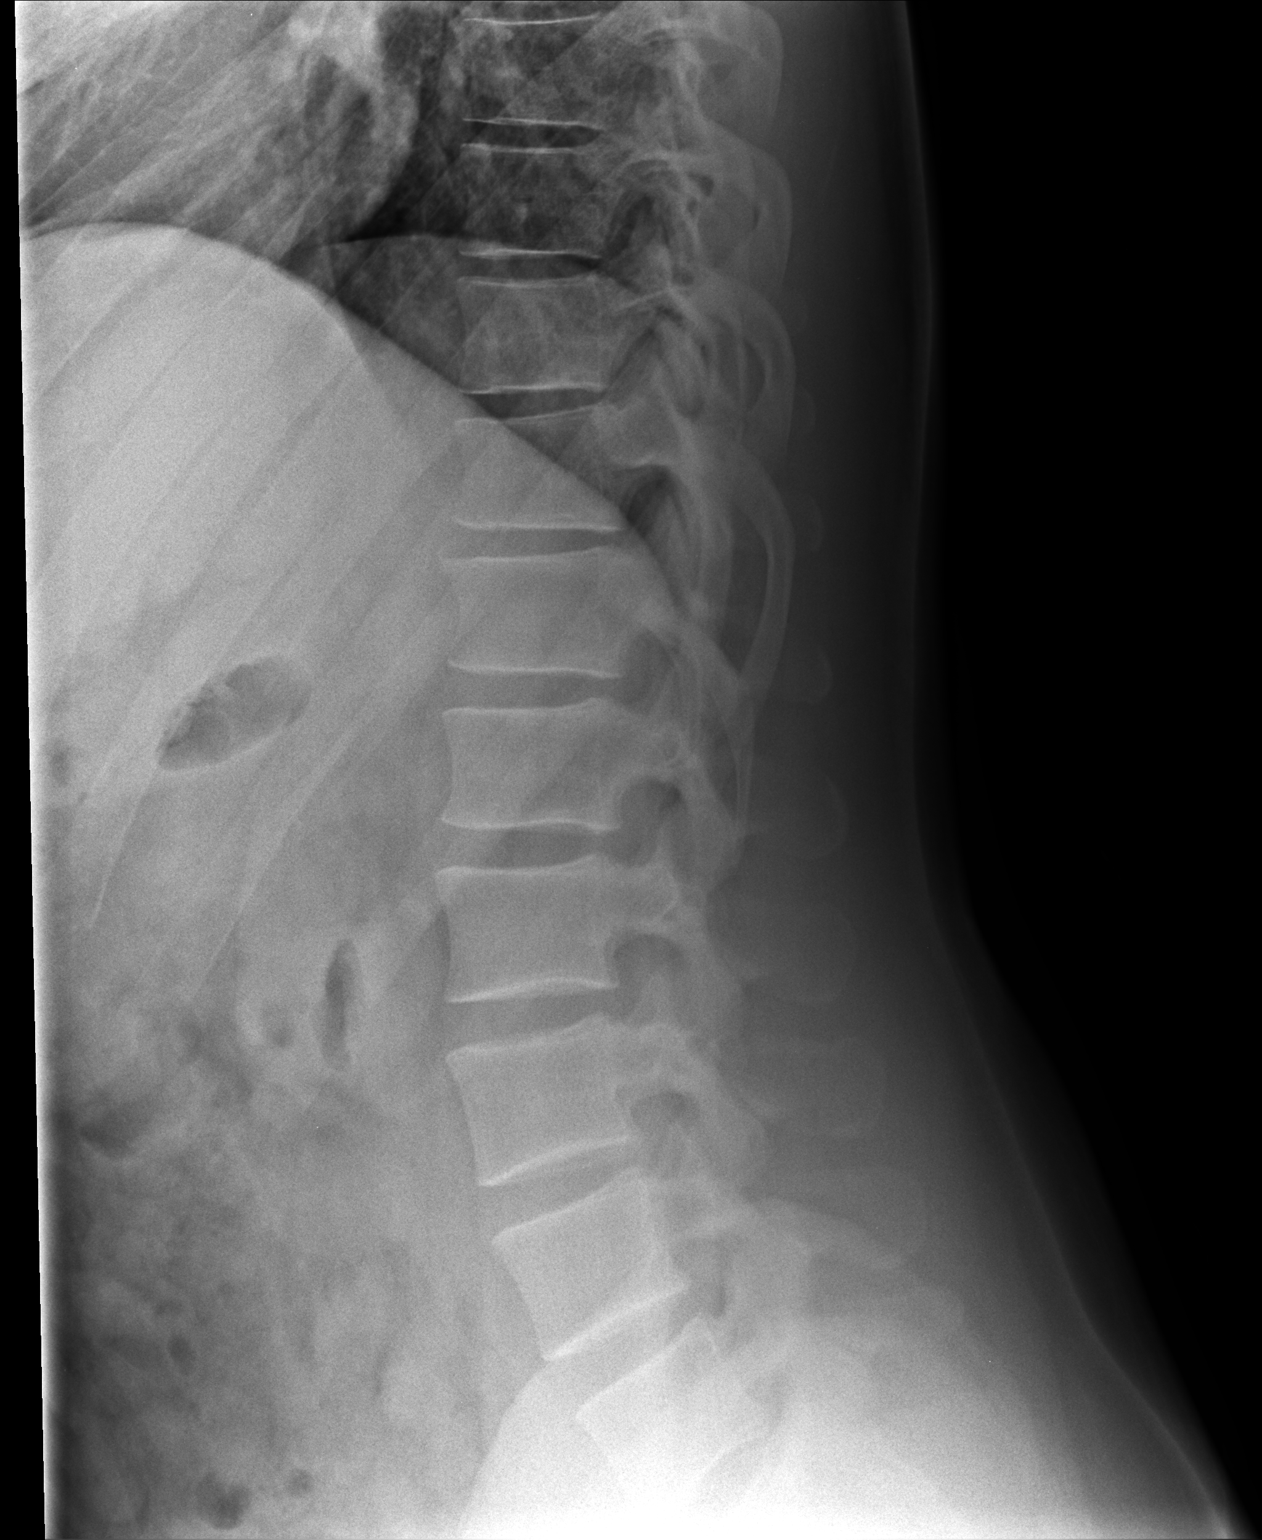

[lateral]
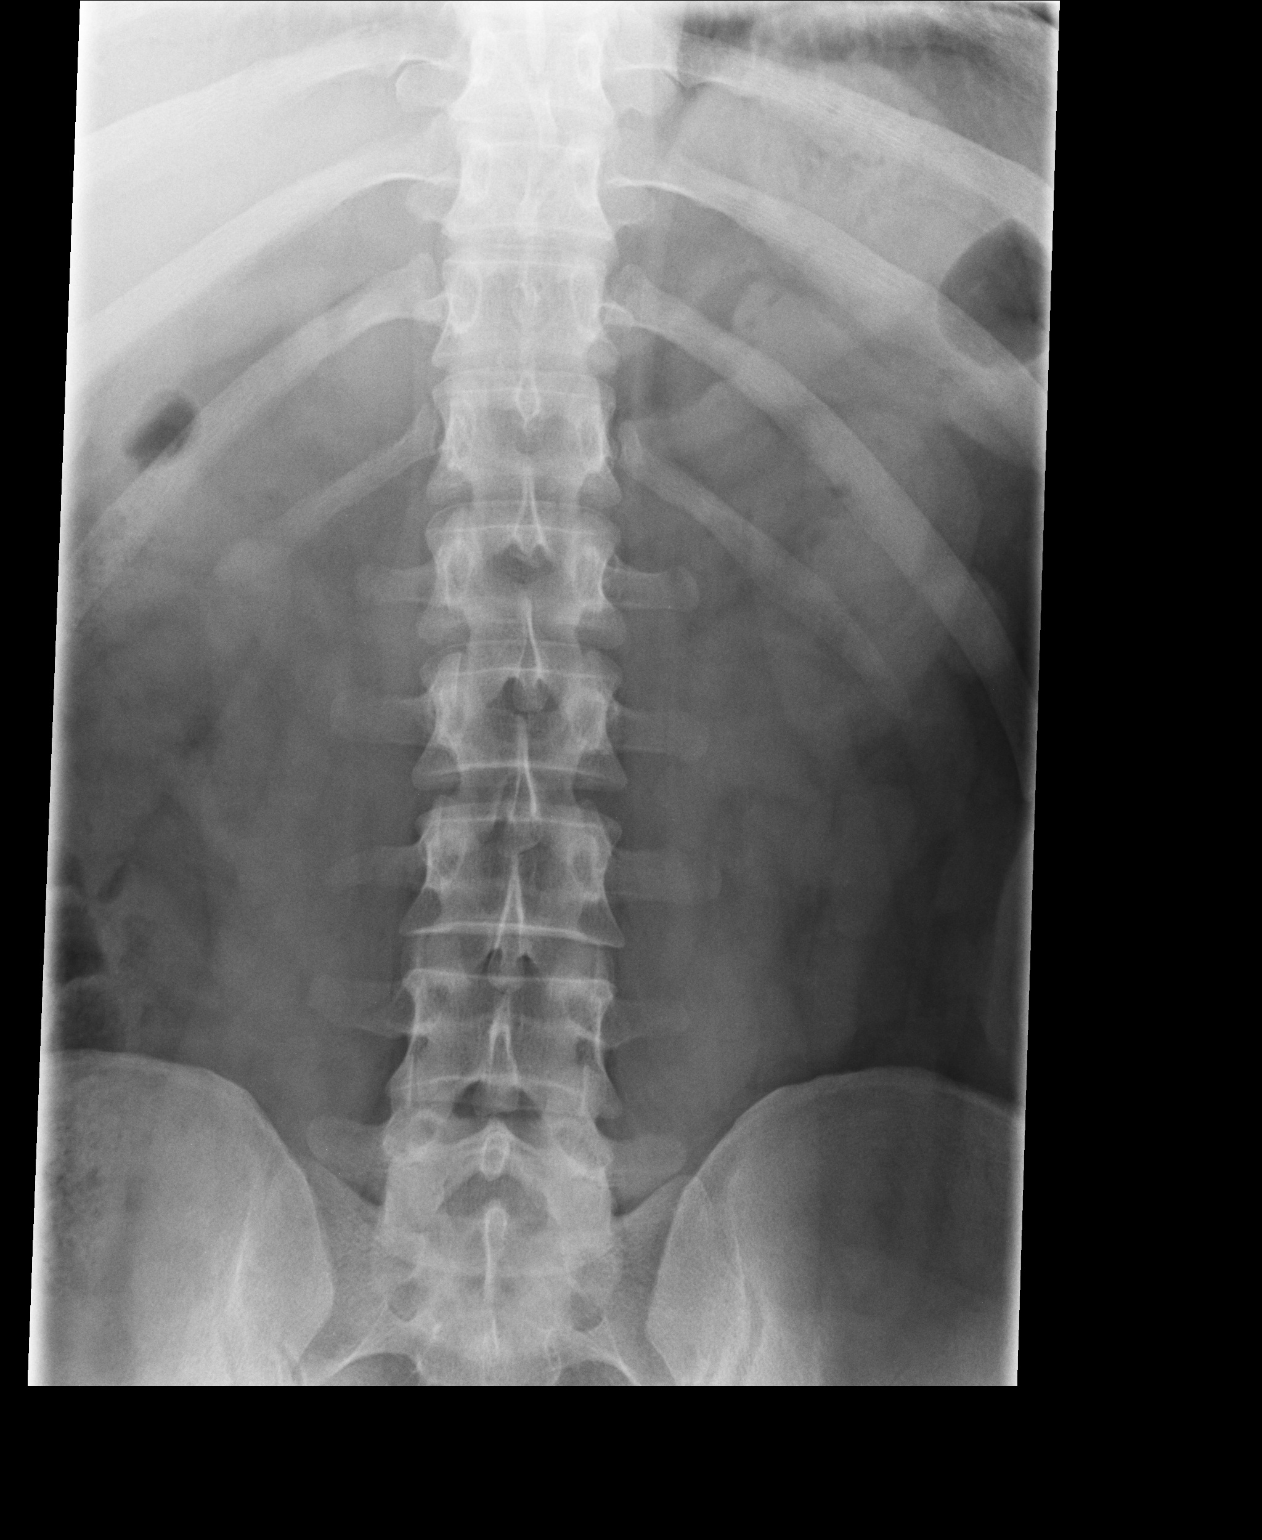

[l5 s1]
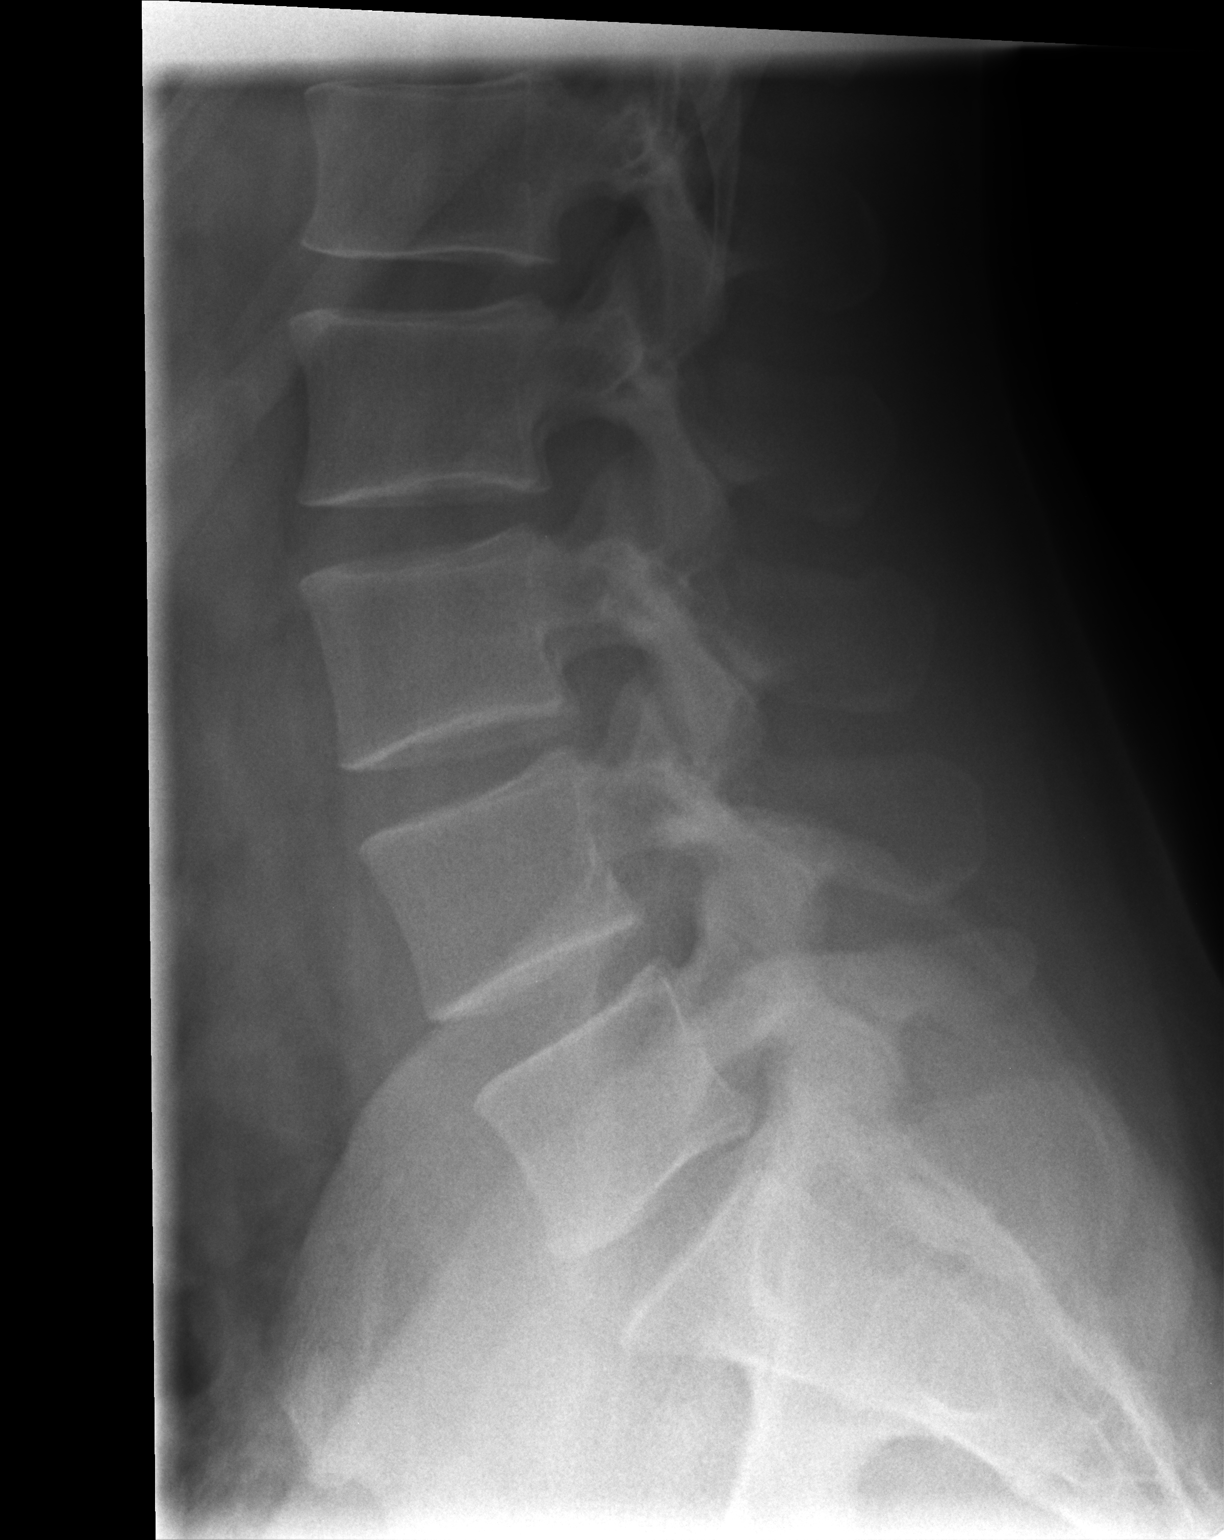

[3 of 3 positions shown; findings below may reference images not displayed]

FINDINGS: Five lumbar-type vertebral bodies.

Normal lumbar lordosis.

No evidence of fracture or dislocation.  Vertebral body heights and
intervertebral disc spaces are maintained.

The visualized bony pelvis appears intact.
IMPRESSION: Normal lumbar spine radiographs.

## 2015-11-27 ENCOUNTER — Ambulatory Visit (INDEPENDENT_AMBULATORY_CARE_PROVIDER_SITE_OTHER): Payer: BLUE CROSS/BLUE SHIELD | Admitting: Physician Assistant

## 2015-11-27 VITALS — BP 118/76 | HR 62 | Temp 97.9°F | Resp 16 | Ht 70.0 in | Wt 214.0 lb

## 2015-11-27 DIAGNOSIS — J029 Acute pharyngitis, unspecified: Secondary | ICD-10-CM

## 2015-11-27 DIAGNOSIS — H109 Unspecified conjunctivitis: Secondary | ICD-10-CM | POA: Diagnosis not present

## 2015-11-27 MED ORDER — ERYTHROMYCIN 5 MG/GM OP OINT: 1.0000 "application " | TOPICAL_OINTMENT | Freq: Four times a day (QID) | OPHTHALMIC | Status: AC

## 2015-11-27 MED ORDER — AMOXICILLIN 875 MG PO TABS: 875.0000 mg | ORAL_TABLET | Freq: Two times a day (BID) | ORAL | Status: AC

## 2015-11-27 MED ORDER — CETIRIZINE HCL 10 MG PO TABS: 10.0000 mg | ORAL_TABLET | Freq: Every day | ORAL | Status: AC

## 2015-11-27 NOTE — Progress Notes (Signed)
[facility] [address], [place] [address] [phone]  Date:  11/27/2015   Name:  Nicholas [name]   DOB:  1981/10/29   MRN:  [id number]  PCP:  No primary care provider on file.   Chief Complaint  Patient presents with  . Sore Throat    x 1 week   . Eye Problem    both , x 1 day     History of Present Illness:  Nicholas [name] is a 34 y.o. male patient who presents to [facility] for cc of sore throat for 1 week and eye discharge and redness for 1 day.  Patient reports that he has had an unrelenting sore throat for 1 week.  Very minimal nasal congestion.  He denies fever.  May be possible sick contact with strep.  Yesterday he developed eye redness.  He woke with his eyes matted shut.  They have discharge, and mildly pruritic.  Mild pain with movement.  No vision changes.     There are no active problems to display for this patient.   Past Medical History  Diagnosis Date  . Asthma     no current meds.  . Seizures (HCC)     seizure x 1 3 yrs. ago - no cause found  . Subaortic stenosis     states is mild, no cardiologist, last echo over 5 yrs. ago  . Finger fracture, left     left 5th; fell down [address] 11/05/2011    Past Surgical History  Procedure Laterality Date  . Testicle [age]  age [age]  . Orif finger fracture  11/07/2011    Procedure: OPEN REDUCTION INTERNAL FIXATION (ORIF) METACARPAL (FINGER) FRACTURE;  Surgeon: Wyn Forster., MD;  Location: [facility];  Service: Orthopedics;  Laterality: Left;  left small proximal phalanx  . Achilles tendon rupure      Social History  Substance Use Topics  . Smoking status: Never Smoker   . Smokeless tobacco: Never Used  . Alcohol Use: Yes     Comment: 1 x/wk.    Family History  Problem Relation Age of Onset  . Heart disease Father     No Known Allergies  Medication list has been reviewed and updated.  No current outpatient prescriptions on file prior to visit.    No current facility-administered medications on file prior to visit.    ROS ROS otherwise unremarkable unless listed above.  Physical Examination: BP 118/76 mmHg  Pulse 62  Temp(Src) 97.9 F (36.6 C) (Oral)  Resp 16  Ht  (1.778 m)  Wt 214 lb (97.07 kg)  BMI 30.71 kg/m2  SpO2 99% Ideal Body Weight: Weight in (lb) to have BMI = 25: 173.9  Physical Exam  Constitutional: He is oriented to person, place, and time. He appears well-developed and well-nourished. No distress.  HENT:  Head: Atraumatic.  Right Ear: Tympanic membrane, external ear and ear canal normal.  Left Ear: Tympanic membrane, external ear and ear canal normal.  Nose: Rhinorrhea present. No mucosal edema. Right sinus exhibits no maxillary sinus tenderness and no frontal sinus tenderness. Left sinus exhibits no maxillary sinus tenderness and no frontal sinus tenderness.  Mouth/Throat: No uvula swelling. Posterior oropharyngeal erythema present. No oropharyngeal exudate.  Eyes: EOM and lids are normal. Pupils are equal, round, and reactive to light. Right conjunctiva is injected. Left conjunctiva is injected. Right eye exhibits normal extraocular motion. Left eye exhibits normal extraocular motion.  Mildly edematous  upper eyelids.  No pain with ocular movement.  Neck: Trachea normal and full passive range of motion without pain. No edema and no erythema present.  Cardiovascular: Normal rate.   Pulmonary/Chest: Effort normal. No respiratory distress. He has no decreased breath sounds. He has no wheezes. He has no rhonchi.  Neurological: He is alert and oriented to person, place, and time.  Skin: Skin is warm and dry. He is not diaphoretic.  Psychiatric: He has a normal mood and affect. His behavior is normal.      Assessment and Plan: Maple HudsonCharles C All is a 34 y.o. male who is here today for sore throat, and eye changes.  This looks most consistent with viral.  However will treat with longevity of illness.   Advised eye drops and eye ointment.  rtc if sxs worsen or fail to improve.    Bilateral conjunctivitis - Plan: amoxicillin (AMOXIL) 875 MG tablet, cetirizine (ZYRTEC) 10 MG tablet, erythromycin ophthalmic ointment   Trena PlattStephanie Joniqua Sidle, PA-C [facility] 11/27/2015 10:16 AM

## 2015-11-27 NOTE — Patient Instructions (Signed)
Please take medication as prescribed. Hydrate well with 64oz of water per day  Viral Conjunctivitis Viral conjunctivitis is an inflammation of the clear membrane that covers the white part of your eye and the inner surface of your eyelid (conjunctiva). The inflammation is caused by a viral infection. The blood vessels in the conjunctiva become inflamed, causing the eye to become red or pink, and often itchy. Viral conjunctivitis can easily be passed from one person to another (contagious). CAUSES  Viral conjunctivitis is caused by a virus. A virus is a type of contagious germ. It can be spread by touching objects that have been contaminated with the virus, such as doorknobs or towels.  SYMPTOMS  Symptoms of viral conjunctivitis may include:   Eye redness.  Tearing or watery eyes.  Itchy eyes.  Burning feeling in the eyes.  Clear drainage from the eye.  Swollen eyelids.  A gritty feeling in the eye.  Light sensitivity. DIAGNOSIS  Viral conjunctivitis may be diagnosed with a medical history and physical exam. If you have discharge from your eye, the discharge may be tested to rule out other causes of conjunctivitis.  TREATMENT  Viral conjunctivitis does not respond to medicines that kill bacteria (antibiotics). Treatment for viral conjunctivitis is directed at stopping a bacterial infection from developing in addition to the viral infection. Treatment also aims to relieve your symptoms, such as itching. This may be done with antihistamine drops or other eye medicines. HOME CARE INSTRUCTIONS  Take medicines only as directed by your health care provider.  Avoid touching or rubbing your eyes.  Apply a warm, clean washcloth to your eye for 10-20 minutes, 3-4 times per day.  If you wear contact lenses, do not wear them until the inflammation is gone and your health care provider says it is safe to wear them again. Ask your health care provider how to sterilize or replace your contact  lenses before using them again. Wear glasses until you can resume wearing contacts.  Avoid wearing eye makeup until the inflammation is gone. Throw away any old eye cosmetics that may be contaminated.  Change or wash your pillowcase every day.  Do not share towels or washcloths. This may spread the infection.  Wash your hands often with soap and water. Use paper towels to dry your hands.  Gently wipe away any drainage from your eye with a warm, wet washcloth or a cotton ball.  Be very careful to avoid touching the edge of the eyelid with the eye drop bottle or ointment tube when applying medicines to the affected eye. This will stop you from spreading the infection to the other eye or to other people. SEEK MEDICAL CARE IF:   Your symptoms do not improve with treatment.  You have increased pain.  Your vision becomes blurry.  You have a fever.  You have facial pain, redness, or swelling.  You have new symptoms.  Your symptoms get worse.   This information is not intended to replace advice given to you by your health care provider. Make sure you discuss any questions you have with your health care provider.   Document Released: 02/24/2003 Document Revised: 05/28/2006 Document Reviewed: 09/15/2014 Elsevier Interactive Patient Education [employer]2016 [employer]

## 2015-12-05 ENCOUNTER — Encounter: Payer: Self-pay | Admitting: Physician Assistant

## 2016-10-20 DIAGNOSIS — Z23 Encounter for immunization: Secondary | ICD-10-CM | POA: Diagnosis not present

## 2016-10-20 DIAGNOSIS — Z6831 Body mass index (BMI) 31.0-31.9, adult: Secondary | ICD-10-CM | POA: Diagnosis not present

## 2016-10-20 DIAGNOSIS — J302 Other seasonal allergic rhinitis: Secondary | ICD-10-CM | POA: Diagnosis not present

## 2016-10-20 DIAGNOSIS — R42 Dizziness and giddiness: Secondary | ICD-10-CM | POA: Diagnosis not present

## 2016-10-20 DIAGNOSIS — K580 Irritable bowel syndrome with diarrhea: Secondary | ICD-10-CM | POA: Diagnosis not present

## 2017-04-24 DIAGNOSIS — Z0000 Encounter for general adult medical examination without abnormal findings: Secondary | ICD-10-CM | POA: Diagnosis not present

## 2017-04-26 DIAGNOSIS — Z6831 Body mass index (BMI) 31.0-31.9, adult: Secondary | ICD-10-CM | POA: Diagnosis not present

## 2017-04-26 DIAGNOSIS — Z0000 Encounter for general adult medical examination without abnormal findings: Secondary | ICD-10-CM | POA: Diagnosis not present

## 2017-05-09 DIAGNOSIS — W64XXXA Exposure to other animate mechanical forces, initial encounter: Secondary | ICD-10-CM | POA: Diagnosis not present

## 2017-05-09 DIAGNOSIS — L0390 Cellulitis, unspecified: Secondary | ICD-10-CM | POA: Diagnosis not present

## 2017-05-09 DIAGNOSIS — T148XXA Other injury of unspecified body region, initial encounter: Secondary | ICD-10-CM | POA: Diagnosis not present

## 2017-05-09 DIAGNOSIS — Z6831 Body mass index (BMI) 31.0-31.9, adult: Secondary | ICD-10-CM | POA: Diagnosis not present

## 2017-11-17 DIAGNOSIS — J019 Acute sinusitis, unspecified: Secondary | ICD-10-CM | POA: Diagnosis not present

## 2017-11-17 DIAGNOSIS — J22 Unspecified acute lower respiratory infection: Secondary | ICD-10-CM | POA: Diagnosis not present

## 2017-11-17 DIAGNOSIS — B9689 Other specified bacterial agents as the cause of diseases classified elsewhere: Secondary | ICD-10-CM | POA: Diagnosis not present

## 2018-07-01 DIAGNOSIS — Z1329 Encounter for screening for other suspected endocrine disorder: Secondary | ICD-10-CM | POA: Diagnosis not present

## 2018-07-01 DIAGNOSIS — Z0000 Encounter for general adult medical examination without abnormal findings: Secondary | ICD-10-CM | POA: Diagnosis not present

## 2018-07-01 DIAGNOSIS — E782 Mixed hyperlipidemia: Secondary | ICD-10-CM | POA: Diagnosis not present

## 2018-07-01 DIAGNOSIS — Z114 Encounter for screening for human immunodeficiency virus [HIV]: Secondary | ICD-10-CM | POA: Diagnosis not present

## 2018-07-03 DIAGNOSIS — Z6831 Body mass index (BMI) 31.0-31.9, adult: Secondary | ICD-10-CM | POA: Diagnosis not present

## 2018-07-03 DIAGNOSIS — Z0000 Encounter for general adult medical examination without abnormal findings: Secondary | ICD-10-CM | POA: Diagnosis not present

## 2018-07-03 DIAGNOSIS — R1031 Right lower quadrant pain: Secondary | ICD-10-CM | POA: Diagnosis not present

## 2018-07-18 DIAGNOSIS — E039 Hypothyroidism, unspecified: Secondary | ICD-10-CM | POA: Diagnosis not present

## 2018-07-24 DIAGNOSIS — R946 Abnormal results of thyroid function studies: Secondary | ICD-10-CM | POA: Diagnosis not present

## 2018-07-24 DIAGNOSIS — Z6831 Body mass index (BMI) 31.0-31.9, adult: Secondary | ICD-10-CM | POA: Diagnosis not present

## 2018-07-24 DIAGNOSIS — Z72820 Sleep deprivation: Secondary | ICD-10-CM | POA: Diagnosis not present

## 2018-07-24 DIAGNOSIS — R1031 Right lower quadrant pain: Secondary | ICD-10-CM | POA: Diagnosis not present

## 2018-08-26 DIAGNOSIS — Z6831 Body mass index (BMI) 31.0-31.9, adult: Secondary | ICD-10-CM | POA: Diagnosis not present

## 2018-08-26 DIAGNOSIS — L259 Unspecified contact dermatitis, unspecified cause: Secondary | ICD-10-CM | POA: Diagnosis not present

## 2018-08-26 DIAGNOSIS — Z23 Encounter for immunization: Secondary | ICD-10-CM | POA: Diagnosis not present

## 2018-09-05 DIAGNOSIS — R946 Abnormal results of thyroid function studies: Secondary | ICD-10-CM | POA: Diagnosis not present

## 2018-09-05 DIAGNOSIS — L259 Unspecified contact dermatitis, unspecified cause: Secondary | ICD-10-CM | POA: Diagnosis not present

## 2018-09-05 DIAGNOSIS — E039 Hypothyroidism, unspecified: Secondary | ICD-10-CM | POA: Diagnosis not present

## 2018-09-12 DIAGNOSIS — Z6831 Body mass index (BMI) 31.0-31.9, adult: Secondary | ICD-10-CM | POA: Diagnosis not present

## 2018-09-12 DIAGNOSIS — R946 Abnormal results of thyroid function studies: Secondary | ICD-10-CM | POA: Diagnosis not present

## 2018-09-18 DIAGNOSIS — S86021A Laceration of right Achilles tendon, initial encounter: Secondary | ICD-10-CM | POA: Diagnosis not present

## 2018-09-20 DIAGNOSIS — S86012A Strain of left Achilles tendon, initial encounter: Secondary | ICD-10-CM | POA: Diagnosis not present

## 2018-09-23 DIAGNOSIS — Y999 Unspecified external cause status: Secondary | ICD-10-CM | POA: Diagnosis not present

## 2018-09-23 DIAGNOSIS — X58XXXA Exposure to other specified factors, initial encounter: Secondary | ICD-10-CM | POA: Diagnosis not present

## 2018-09-23 DIAGNOSIS — S86092S Other specified injury of left Achilles tendon, sequela: Secondary | ICD-10-CM | POA: Diagnosis not present

## 2018-09-23 DIAGNOSIS — G8918 Other acute postprocedural pain: Secondary | ICD-10-CM | POA: Diagnosis not present

## 2018-09-23 DIAGNOSIS — S86092A Other specified injury of left Achilles tendon, initial encounter: Secondary | ICD-10-CM | POA: Diagnosis not present

## 2018-10-04 DIAGNOSIS — Z9889 Other specified postprocedural states: Secondary | ICD-10-CM | POA: Diagnosis not present

## 2018-11-18 DIAGNOSIS — Z9889 Other specified postprocedural states: Secondary | ICD-10-CM | POA: Diagnosis not present

## 2018-11-19 DIAGNOSIS — S86012D Strain of left Achilles tendon, subsequent encounter: Secondary | ICD-10-CM | POA: Diagnosis not present

## 2018-11-26 DIAGNOSIS — S86012D Strain of left Achilles tendon, subsequent encounter: Secondary | ICD-10-CM | POA: Diagnosis not present

## 2018-12-23 DIAGNOSIS — M66361 Spontaneous rupture of flexor tendons, right lower leg: Secondary | ICD-10-CM | POA: Diagnosis not present

## 2018-12-24 DIAGNOSIS — S86012D Strain of left Achilles tendon, subsequent encounter: Secondary | ICD-10-CM | POA: Diagnosis not present

## 2018-12-30 DIAGNOSIS — L03211 Cellulitis of face: Secondary | ICD-10-CM | POA: Diagnosis not present

## 2018-12-31 DIAGNOSIS — Z6830 Body mass index (BMI) 30.0-30.9, adult: Secondary | ICD-10-CM | POA: Diagnosis not present

## 2018-12-31 DIAGNOSIS — J340 Abscess, furuncle and carbuncle of nose: Secondary | ICD-10-CM | POA: Diagnosis not present

## 2019-01-01 DIAGNOSIS — Z6830 Body mass index (BMI) 30.0-30.9, adult: Secondary | ICD-10-CM | POA: Diagnosis not present

## 2019-01-01 DIAGNOSIS — J340 Abscess, furuncle and carbuncle of nose: Secondary | ICD-10-CM | POA: Diagnosis not present

## 2019-01-03 DIAGNOSIS — J340 Abscess, furuncle and carbuncle of nose: Secondary | ICD-10-CM | POA: Diagnosis not present

## 2019-01-03 DIAGNOSIS — Z6830 Body mass index (BMI) 30.0-30.9, adult: Secondary | ICD-10-CM | POA: Diagnosis not present

## 2019-01-03 DIAGNOSIS — Z7721 Contact with and (suspected) exposure to potentially hazardous body fluids: Secondary | ICD-10-CM | POA: Diagnosis not present

## 2019-05-20 DIAGNOSIS — Z20828 Contact with and (suspected) exposure to other viral communicable diseases: Secondary | ICD-10-CM | POA: Diagnosis not present

## 2019-05-21 DIAGNOSIS — Z03818 Encounter for observation for suspected exposure to other biological agents ruled out: Secondary | ICD-10-CM | POA: Diagnosis not present
